# Patient Record
Sex: Female | Born: 1995 | Race: Black or African American | Hispanic: No | Marital: Single | Smoking: Never smoker
Health system: Southern US, Community
[De-identification: ages and names within clinical notes are randomized; demographics above are authoritative.]

---
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
## 2016-01-27 ENCOUNTER — Encounter: Payer: Self-pay | Admitting: Obstetrics & Gynecology

## 2016-01-27 ENCOUNTER — Ambulatory Visit (INDEPENDENT_AMBULATORY_CARE_PROVIDER_SITE_OTHER): Payer: BLUE CROSS/BLUE SHIELD | Admitting: Obstetrics & Gynecology

## 2016-01-27 VITALS — BP 106/56 | HR 55 | Temp 98.4°F | Ht 70.0 in | Wt 224.3 lb

## 2016-01-27 DIAGNOSIS — Z30431 Encounter for routine checking of intrauterine contraceptive device: Secondary | ICD-10-CM

## 2016-01-27 NOTE — Progress Notes (Signed)
   Subjective:    Patient ID: Daisy Roberts Reason, female    DOB: 10-Mar-1996, 20 y.o.   MRN: [id number]  HPI 20 yo S AA lady here for a string check. I placed a Lyletta about 5 weeks ago. She has had essentially no bleeding. She is happy with this method.   Review of Systems     Objective:   Physical Exam  WNWHBFNAD Breathing, conversing, and ambulating normally Abd- benign IUD strings seen      Assessment & Plan:  String check- doing well RTC 1 year/prn sooner

## 2017-05-25 ENCOUNTER — Encounter (HOSPITAL_COMMUNITY): Payer: Self-pay | Admitting: Emergency Medicine

## 2017-05-25 ENCOUNTER — Ambulatory Visit (HOSPITAL_COMMUNITY)
Admission: EM | Admit: 2017-05-25 | Discharge: 2017-05-25 | Disposition: A | Discharge status: Home / Self Care | Payer: PRIVATE HEALTH INSURANCE | Attending: Family Medicine | Admitting: Family Medicine

## 2017-05-25 ENCOUNTER — Ambulatory Visit (INDEPENDENT_AMBULATORY_CARE_PROVIDER_SITE_OTHER): Payer: PRIVATE HEALTH INSURANCE

## 2017-05-25 DIAGNOSIS — Z3202 Encounter for pregnancy test, result negative: Secondary | ICD-10-CM | POA: Diagnosis not present

## 2017-05-25 DIAGNOSIS — R1032 Left lower quadrant pain: Secondary | ICD-10-CM | POA: Diagnosis not present

## 2017-05-25 DIAGNOSIS — R109 Unspecified abdominal pain: Secondary | ICD-10-CM | POA: Diagnosis not present

## 2017-05-25 LAB — POCT URINALYSIS DIP (DEVICE)
Bilirubin Urine: NEGATIVE
GLUCOSE, UA: NEGATIVE mg/dL
Ketones, ur: NEGATIVE mg/dL
NITRITE: NEGATIVE
PH: 7.5 (ref 5.0–8.0)
Protein, ur: NEGATIVE mg/dL
Specific Gravity, Urine: 1.02 (ref 1.005–1.030)
Urobilinogen, UA: 0.2 mg/dL (ref 0.0–1.0)

## 2017-05-25 LAB — POCT PREGNANCY, URINE: PREG TEST UR: NEGATIVE

## 2017-05-25 NOTE — ED Provider Notes (Signed)
CSN: [id number]     Arrival date & time 05/25/17  1456 History   First MD Initiated Contact with Patient 05/25/17 1518     Chief Complaint  Patient presents with  . Abdominal Pain   (Consider location/radiation/quality/duration/timing/severity/associated sxs/prior Treatment) 21 year old female states she has been having abdominal pain primarily in the left lower quadrant every day for the past 4 days. She describes it as sharp in the left lower quadrant and pain that radiates toward the midline below the umbilicus. Occasionally has nausea but no vomiting. Denies constipation or diarrhea. Denies fever, chills, upper respiratory symptoms, chest pain, shortness of breath, palpitations, myalgias, urinary symptoms or vaginal discharge.      History reviewed. No pertinent past medical history. Past Surgical History:  Procedure Laterality Date  . KNEE SURGERY  2014   History reviewed. No pertinent family history. Social History  Substance Use Topics  . Smoking status: Never Smoker  . Smokeless tobacco: Never Used  . Alcohol use 0.0 oz/week     Comment: occasional drinker    OB History    Gravida Para Term Preterm AB Living   0 0 0 0 0 0   SAB TAB Ectopic Multiple Live Births   0 0 0 0       Review of Systems  Constitutional: Negative.   Gastrointestinal:       See history of present illness  Neurological: Negative.   All other systems reviewed and are negative.   Allergies  Patient has no known allergies.  Home Medications   Prior to Admission medications   Medication Sig Start Date End Date Taking? Authorizing Provider  ferrous sulfate 325 (65 FE) MG tablet Take 325 mg by mouth daily with breakfast.    [provider]   Meds Ordered and Administered this Visit  Medications - No data to display  BP 120/76 (BP Location: Right Arm)   Pulse 65   Temp 98.7 F (37.1 C) (Oral)   SpO2 98%  No data found.   Physical Exam  Constitutional: She is oriented to  person, place, and time. She appears well-developed and well-nourished. No distress.  Neck: Normal range of motion. Neck supple.  Cardiovascular: Normal rate, regular rhythm, normal heart sounds and intact distal pulses.   Pulmonary/Chest: Effort normal and breath sounds normal. No respiratory distress.  Abdominal: Soft. She exhibits no distension. There is no tenderness.  Percussion tympanic in the epigastrium. The left hemiabdomen and left lower quadrant percusses dull to flat. There is tenderness in the left lower most quadrant and lesser tenderness at the midline/suprapubic area.  Musculoskeletal: Normal range of motion. She exhibits no edema.  Neurological: She is alert and oriented to person, place, and time. No cranial nerve deficit. She exhibits normal muscle tone.  Skin: Skin is warm and dry.  Psychiatric: She has a normal mood and affect.  Nursing note and vitals reviewed.   Urgent Care Course     Procedures (including critical care time)  Labs Review Labs Reviewed  POCT URINALYSIS DIP (DEVICE) - Abnormal; Notable for the following:       Result Value   Hgb urine dipstick TRACE (*)    Leukocytes, UA TRACE (*)    All other components within normal limits  POCT PREGNANCY, URINE    Imaging Review Dg Abd 1 View  Result Date: 05/25/2017 CLINICAL DATA:  Abdominal pain EXAM: ABDOMEN - 1 VIEW COMPARISON:  None. FINDINGS: There is moderate stool in the colon. There is no bowel  dilatation or air-fluid level to suggest bowel obstruction. No free air. There is an intrauterine device in mid pelvis just to the left of midline. IMPRESSION: Intrauterine device in pelvis. Moderate stool throughout colon. No bowel obstruction or free air. Electronically Signed   By: Bretta BangWilliam  Woodruff III M.D.   On: 05/25/2017 16:23     Visual Acuity Review  Right Eye Distance:   Left Eye Distance:   Bilateral Distance:    Right Eye Near:   Left Eye Near:    Bilateral Near:         MDM   1.  Left lower quadrant pain    Use MiraLAX as directed. 3 glasses initially. If not having good results in about 6 hours repeat to other glasses. Drink plenty of fluids. Increase fiber in  diet. If you develop worsening pain. Nausea vomiting, fever, chills and you may need to go to the emergency department.    Hayden RasmussenMabe, Xzaviar Maloof, NP 05/25/17 [phone]

## 2017-05-25 NOTE — ED Triage Notes (Signed)
Pt reports abdominal pain that began on Monday.  She states it is cramping in the mid lower portion of her abdomen and sharp in the LLQ.  She also reports some nausea with the pain and loss of appetite due to the nausea.

## 2017-05-25 NOTE — Discharge Instructions (Signed)
Use MiraLAX as directed. 3 glasses initially. If not having good results in about 6 hours repeat to other glasses. Drink plenty of fluids. Increase fiber in  diet. If you develop worsening pain. Nausea vomiting, fever, chills and he may need to go to the emergency department.

## 2017-05-26 ENCOUNTER — Emergency Department (HOSPITAL_COMMUNITY)
Admission: EM | Admit: 2017-05-26 | Discharge: 2017-05-26 | Disposition: A | Discharge status: Home / Self Care | Payer: No Typology Code available for payment source | Attending: Emergency Medicine | Admitting: Emergency Medicine

## 2017-05-26 ENCOUNTER — Encounter (HOSPITAL_COMMUNITY): Payer: Self-pay | Admitting: Emergency Medicine

## 2017-05-26 ENCOUNTER — Emergency Department (HOSPITAL_COMMUNITY): Payer: No Typology Code available for payment source

## 2017-05-26 DIAGNOSIS — R109 Unspecified abdominal pain: Secondary | ICD-10-CM | POA: Diagnosis present

## 2017-05-26 DIAGNOSIS — N390 Urinary tract infection, site not specified: Secondary | ICD-10-CM | POA: Insufficient documentation

## 2017-05-26 LAB — COMPREHENSIVE METABOLIC PANEL
ALT: 8 U/L — AB (ref 14–54)
AST: 16 U/L (ref 15–41)
Albumin: 4.2 g/dL (ref 3.5–5.0)
Alkaline Phosphatase: 43 U/L (ref 38–126)
Anion gap: 10 (ref 5–15)
BUN: 10 mg/dL (ref 6–20)
CHLORIDE: 105 mmol/L (ref 101–111)
CO2: 25 mmol/L (ref 22–32)
CREATININE: 0.87 mg/dL (ref 0.44–1.00)
Calcium: 9.2 mg/dL (ref 8.9–10.3)
GFR calc Af Amer: >60 mL/min (ref >60)
GLUCOSE: 89 mg/dL (ref 65–99)
Potassium: 3.9 mmol/L (ref 3.5–5.1)
Sodium: 140 mmol/L (ref 135–145)
Total Bilirubin: 1 mg/dL (ref 0.3–1.2)
Total Protein: 7.1 g/dL (ref 6.5–8.1)

## 2017-05-26 LAB — CBC
HCT: 38.9 % (ref 36.0–46.0)
Hemoglobin: 13.8 g/dL (ref 12.0–15.0)
MCH: 29.4 pg (ref 26.0–34.0)
MCHC: 35.5 g/dL (ref 30.0–36.0)
MCV: 82.9 fL (ref 78.0–100.0)
PLATELETS: 197 10*3/uL (ref 150–400)
RBC: 4.69 MIL/uL (ref 3.87–5.11)
RDW: 12 % (ref 11.5–15.5)
WBC: 6.7 10*3/uL (ref 4.0–10.5)

## 2017-05-26 LAB — URINALYSIS, ROUTINE W REFLEX MICROSCOPIC
Bilirubin Urine: NEGATIVE
Glucose, UA: NEGATIVE mg/dL
Ketones, ur: 5 mg/dL — AB
Nitrite: NEGATIVE
PH: 5 (ref 5.0–8.0)
Protein, ur: 100 mg/dL — AB
SPECIFIC GRAVITY, URINE: 1.021 (ref 1.005–1.030)

## 2017-05-26 LAB — PREGNANCY, URINE: PREG TEST UR: NEGATIVE

## 2017-05-26 LAB — LIPASE, BLOOD: LIPASE: 21 U/L (ref 11–51)

## 2017-05-26 MED ORDER — DEXTROSE 5 % IV SOLN
1.0000 g | Freq: Once | INTRAVENOUS | Status: AC
  Administered 2017-05-26: 1 g via INTRAVENOUS
  Filled 2017-05-26: qty 10

## 2017-05-26 MED ORDER — CEPHALEXIN 500 MG PO CAPS: 500.0000 mg | ORAL_CAPSULE | Freq: Four times a day (QID) | ORAL | 0 refills | Status: DC

## 2017-05-26 NOTE — Discharge Instructions (Addendum)
It was our pleasure to provide your ER care today - we hope that you feel better.  Rest. Drink plenty of fluids.  For urine infection, take antibiotic as prescribed.  You may take acetaminophen and/or ibuprofen as need for pain.  Follow up with primary care doctor in the coming week.  Return to ER if worse, new symptoms, high fevers, severe pain, persistent vomiting, other concern.

## 2017-05-26 NOTE — ED Notes (Signed)
Patient transported to CT 

## 2017-05-26 NOTE — ED Triage Notes (Signed)
Pt seen at Urgent care yesterday -- dx with constipation -- no hx of-- prescribed miralax-- has taken 6 caps last night and 1 this am. Minimal results-- still has abd pain, sharp stabbing feeling.

## 2017-05-26 NOTE — ED Provider Notes (Signed)
[facility]-EMERGENCY DEPT Provider Note   CSN: [id number] Arrival date & time: 05/26/17  1343     History   Chief Complaint Chief Complaint  Patient presents with  . Abdominal Pain    HPI Kalana Yust is a 21 y.o. female.  Patient c/o right flank pain in past 4 days. Pain mod-sev. Waxes and wanes in intensity. No hx same pain. Noted hematuria. No dysuria. Denies fever or chills. Denies vaginal bleeding or d/c. Has iud, and states periods v light, last 2 weeks ago. No vaginal discharge or bleeding. No hx kidney stones. No hx ovarian cyst or endometriosis. Denies back pain. +recnet constipation, used miralax as did have bm yesterday.    The history is provided by the patient.  Abdominal Pain   Associated symptoms include hematuria. Pertinent negatives include fever, vomiting, dysuria and headaches.    History reviewed. No pertinent past medical history.  There are no active problems to display for this patient.   Past Surgical History:  Procedure Laterality Date  . KNEE SURGERY  2014    OB History    Gravida Para Term Preterm AB Living   0 0 0 0 0 0   SAB TAB Ectopic Multiple Live Births   0 0 0 0         Home Medications    Prior to Admission medications   Medication Sig Start Date End Date Taking? Authorizing Provider  ferrous sulfate 325 (65 FE) MG tablet Take 325 mg by mouth daily with breakfast.    [provider]    Family History No family history on file.  Social History Social History  Substance Use Topics  . Smoking status: Never Smoker  . Smokeless tobacco: Never Used  . Alcohol use 0.0 oz/week     Comment: occasional drinker      Allergies   Patient has no known allergies.   Review of Systems Review of Systems  Constitutional: Negative for chills and fever.  HENT: Negative for sore throat.   Eyes: Negative for redness.  Respiratory: Negative for cough and shortness of breath.   Cardiovascular: Negative for chest pain.    Gastrointestinal: Positive for abdominal pain. Negative for vomiting.  Endocrine: Negative for polyuria.  Genitourinary: Positive for flank pain and hematuria. Negative for dysuria.  Musculoskeletal: Negative for back pain.  Skin: Negative for rash.  Neurological: Negative for headaches.  Hematological: Does not bruise/bleed easily.  Psychiatric/Behavioral: Negative for confusion.     Physical Exam Updated Vital Signs BP 118/72   Pulse 83   Temp 98.5 F (36.9 C) (Oral)   Resp 16   Ht 1.778 m (5\' 10" )   Wt 88.5 kg (195 lb)   SpO2 100%   BMI 27.98 kg/m   Physical Exam  Constitutional: She appears well-developed and well-nourished. No distress.  HENT:  Head: Atraumatic.  Eyes: Conjunctivae are normal. No scleral icterus.  Neck: Neck supple. No tracheal deviation present.  Cardiovascular: Normal rate and intact distal pulses.   Pulmonary/Chest: Effort normal and breath sounds normal. No respiratory distress.  Abdominal: Soft. Normal appearance and bowel sounds are normal. She exhibits no distension. There is no tenderness.  No hernia.   Genitourinary:  Genitourinary Comments: No cva tenderness  Musculoskeletal: She exhibits no edema.  Neurological: She is alert.  Skin: Skin is warm and dry. No rash noted. She is not diaphoretic.  Psychiatric: She has a normal mood and affect.  Nursing note and vitals reviewed.    ED Treatments /  Results  Labs (all labs ordered are listed, but only abnormal results are displayed)  Results for orders placed or performed during the hospital encounter of 05/26/17  Lipase, blood  Result Value Ref Range   Lipase 21 11 - 51 U/L  Comprehensive metabolic panel  Result Value Ref Range   Sodium 140 135 - 145 mmol/L   Potassium 3.9 3.5 - 5.1 mmol/L   Chloride 105 101 - 111 mmol/L   CO2 25 22 - 32 mmol/L   Glucose, Bld 89 65 - 99 mg/dL   BUN 10 6 - 20 mg/dL   Creatinine, Ser 1.610.87 0.44 - 1.00 mg/dL   Calcium 9.2 8.9 - 09.610.3 mg/dL   Total  Protein 7.1 6.5 - 8.1 g/dL   Albumin 4.2 3.5 - 5.0 g/dL   AST 16 15 - 41 U/L   ALT 8 (L) 14 - 54 U/L   Alkaline Phosphatase 43 38 - 126 U/L   Total Bilirubin 1.0 0.3 - 1.2 mg/dL   GFR calc non Af Amer >60 >60 mL/min   GFR calc Af Amer >60 >60 mL/min   Anion gap 10 5 - 15  CBC  Result Value Ref Range   WBC 6.7 4.0 - 10.5 K/uL   RBC 4.69 3.87 - 5.11 MIL/uL   Hemoglobin 13.8 12.0 - 15.0 g/dL   HCT 04.538.9 40.936.0 - 81.146.0 %   MCV 82.9 78.0 - 100.0 fL   MCH 29.4 26.0 - 34.0 pg   MCHC 35.5 30.0 - 36.0 g/dL   RDW 91.412.0 78.211.5 - 95.615.5 %   Platelets 197 150 - 400 K/uL  Urinalysis, Routine w reflex microscopic  Result Value Ref Range   Color, Urine AMBER (A) YELLOW   APPearance CLOUDY (A) CLEAR   Specific Gravity, Urine 1.021 1.005 - 1.030   pH 5.0 5.0 - 8.0   Glucose, UA NEGATIVE NEGATIVE mg/dL   Hgb urine dipstick LARGE (A) NEGATIVE   Bilirubin Urine NEGATIVE NEGATIVE   Ketones, ur 5 (A) NEGATIVE mg/dL   Protein, ur 213100 (A) NEGATIVE mg/dL   Nitrite NEGATIVE NEGATIVE   Leukocytes, UA LARGE (A) NEGATIVE   RBC / HPF TOO NUMEROUS TO COUNT 0 - 5 RBC/hpf   WBC, UA TOO NUMEROUS TO COUNT 0 - 5 WBC/hpf   Bacteria, UA MANY (A) NONE SEEN   Squamous Epithelial / LPF TOO NUMEROUS TO COUNT (A) NONE SEEN   Mucous PRESENT   Pregnancy, urine  Result Value Ref Range   Preg Test, Ur NEGATIVE NEGATIVE   Dg Abd 1 View  Result Date: 05/25/2017 CLINICAL DATA:  Abdominal pain EXAM: ABDOMEN - 1 VIEW COMPARISON:  None. FINDINGS: There is moderate stool in the colon. There is no bowel dilatation or air-fluid level to suggest bowel obstruction. No free air. There is an intrauterine device in mid pelvis just to the left of midline. IMPRESSION: Intrauterine device in pelvis. Moderate stool throughout colon. No bowel obstruction or free air. Electronically Signed   By: Bretta BangWilliam  Woodruff III M.D.   On: 05/25/2017 16:23   Ct Renal Stone Study  Result Date: 05/26/2017 CLINICAL DATA:  Left flank pain and hematuria. EXAM:  CT ABDOMEN AND PELVIS WITHOUT CONTRAST TECHNIQUE: Multidetector CT imaging of the abdomen and pelvis was performed following the standard protocol without IV contrast. COMPARISON:  Abdominal radiograph yesterday. FINDINGS: Lower chest: The lung bases are clear. Hepatobiliary: No evidence of focal lesion allowing for lack contrast. Gallbladder physiologically distended, no calcified stone. No biliary dilatation. Pancreas: No  ductal dilatation or inflammation. Lack of contrast and paucity of intra-abdominal fat limits assessment. Spleen: Normal in size without focal abnormality on noncontrast exam. Adrenals/Urinary Tract: Kidneys are symmetric in size without stones or hydronephrosis. No perinephric edema. Ureters are decompressed without stones along the course. Urinary bladder is physiologically distended without stone. There is mild perivesicular soft tissue edema. Adrenal glands are poorly visualized, no evidence of nodule. Stomach/Bowel: Lack of contrast and paucity of intra-abdominal fat limits assessment. Stomach is decompressed. No evidence of bowel obstruction. The appendix is normal. Small to moderate colonic stool burden. No evidence of bowel inflammation allowing for lack contrast. Vascular/Lymphatic: Abdominal aorta is normal in caliber. No bulky abdominal or pelvic adenopathy. Reproductive: Uterus is anteverted. IUD in the uterus. Ovaries not confidently identified. No gross adnexal mass. Small volume of pelvic free fluid is likely physiologic. Other: Small volume of pelvic free fluid is likely physiologic. No upper abdominal ascites. No free air. Musculoskeletal: There are no acute or suspicious osseous abnormalities. IMPRESSION: 1.  No renal stones or obstructive uropathy. 2. Mild perivesicular soft tissue edema suggests cystitis. Electronically Signed   By: Rubye Oaks M.D.   On: 05/26/2017 21:37      EKG  EKG Interpretation None       Radiology Dg Abd 1 View  Result Date:  05/25/2017 CLINICAL DATA:  Abdominal pain EXAM: ABDOMEN - 1 VIEW COMPARISON:  None. FINDINGS: There is moderate stool in the colon. There is no bowel dilatation or air-fluid level to suggest bowel obstruction. No free air. There is an intrauterine device in mid pelvis just to the left of midline. IMPRESSION: Intrauterine device in pelvis. Moderate stool throughout colon. No bowel obstruction or free air. Electronically Signed   By: Bretta Bang III M.D.   On: 05/25/2017 16:23    Procedures Procedures (including critical care time)  Medications Ordered in ED Medications - No data to display   Initial Impression / Assessment and Plan / ED Course  I have reviewed the triage vital signs and the nursing notes.  Pertinent labs & imaging results that were available during my care of the patient were reviewed by me and considered in my medical decision making (see chart for details).  Labs, imaging study.  Reviewed nursing notes and prior charts for additional history.   Iv ns bolus.   CT pending.  CT neg for ureteral stone.   UA positive for infxn. Rocephin iv.  Recheck pt, feels improved.   rx for home.   Return precautions provided.     Final Clinical Impressions(s) / ED Diagnoses   Final diagnoses:  None    New Prescriptions New Prescriptions   No medications on file     Cathren Laine, MD 05/26/17 2202

## 2017-05-26 NOTE — ED Notes (Signed)
Pt verbalized understanding of d/c instructions and has no further questions. VSS. NAD.  

## 2017-11-13 ENCOUNTER — Ambulatory Visit: Payer: Self-pay | Admitting: Obstetrics & Gynecology

## 2017-11-13 DIAGNOSIS — Z30431 Encounter for routine checking of intrauterine contraceptive device: Secondary | ICD-10-CM

## 2017-11-13 NOTE — Progress Notes (Signed)
   Subjective:    Patient ID: Daisy Roberts, female    DOB: Aug 26, 1996, 22 y.o.   MRN: [id number]  HPI 22 yo single lady here because she thought that her Liletta needed to be replaced. I put it in on 12/14/15. She is happy with the Liletta. She is having no current complaints.  Review of Systems     Objective:   Physical Exam Breathing, conversing, and ambulating normally Well nourished, well hydrated Black female, no apparent distress     Assessment & Plan:  Contraception- continue with Liletta Come back when she is 22 for a pap smear.

## 2018-07-24 ENCOUNTER — Encounter (HOSPITAL_COMMUNITY): Payer: Self-pay | Admitting: Emergency Medicine

## 2018-07-24 ENCOUNTER — Emergency Department (HOSPITAL_COMMUNITY)
Admission: EM | Admit: 2018-07-24 | Discharge: 2018-07-24 | Disposition: A | Discharge status: Home / Self Care | Payer: No Typology Code available for payment source | Attending: Emergency Medicine | Admitting: Emergency Medicine

## 2018-07-24 ENCOUNTER — Emergency Department (HOSPITAL_COMMUNITY): Payer: No Typology Code available for payment source

## 2018-07-24 DIAGNOSIS — Z23 Encounter for immunization: Secondary | ICD-10-CM | POA: Insufficient documentation

## 2018-07-24 DIAGNOSIS — Y998 Other external cause status: Secondary | ICD-10-CM | POA: Insufficient documentation

## 2018-07-24 DIAGNOSIS — Y9289 Other specified places as the place of occurrence of the external cause: Secondary | ICD-10-CM | POA: Diagnosis not present

## 2018-07-24 DIAGNOSIS — W260XXA Contact with knife, initial encounter: Secondary | ICD-10-CM | POA: Insufficient documentation

## 2018-07-24 DIAGNOSIS — S61210A Laceration without foreign body of right index finger without damage to nail, initial encounter: Secondary | ICD-10-CM | POA: Diagnosis not present

## 2018-07-24 DIAGNOSIS — Y93G1 Activity, food preparation and clean up: Secondary | ICD-10-CM | POA: Insufficient documentation

## 2018-07-24 MED ORDER — TETANUS-DIPHTH-ACELL PERTUSSIS 5-2.5-18.5 LF-MCG/0.5 IM SUSP
0.5000 mL | Freq: Once | INTRAMUSCULAR | Status: AC
  Administered 2018-07-24: 0.5 mL via INTRAMUSCULAR
  Filled 2018-07-24: qty 0.5

## 2018-07-24 NOTE — ED Notes (Signed)
Patient verbalizes understanding of discharge instructions. Opportunity for questioning and answers were provided. Armband removed by staff, pt discharged from ED ambulatory.   

## 2018-07-24 NOTE — Discharge Instructions (Addendum)
Keep wound clean and dry. Wound recheck in 2 days with Worker's Comp.  Return to ER for worsening or concerning symptoms.

## 2018-07-24 NOTE — ED Provider Notes (Signed)
[facility] EMERGENCY DEPARTMENT Provider Note   CSN: [id number] Arrival date & time: 07/24/18  1653     History   Chief Complaint Chief Complaint  Patient presents with  . Finger Injury    HPI Daisy Roberts is a 22 y.o. female.  22 year old female presents with laceration to the right index finger.  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  Patient is left-hand dominant.  Patient denies drainage from the wound, has been cleaning at home.  Last tetanus is greater than 5 years ago, patient refuses tetanus update today.  No other injuries, complaints or concerns.     History reviewed. No pertinent past medical history.  There are no active problems to display for this patient.   Past Surgical History:  Procedure Laterality Date  . KNEE SURGERY  2014     OB History    Gravida  0   Para  0   Term  0   Preterm  0   AB  0   Living  0     SAB  0   TAB  0   Ectopic  0   Multiple  0   Live Births               Home Medications    Prior to Admission medications   Medication Sig Start Date End Date Taking? Authorizing Provider  cephALEXin (KEFLEX) 500 MG capsule Take 1 capsule (500 mg total) by mouth 4 (four) times daily. 05/26/17   Cathren Laine, MD  ferrous sulfate 325 (65 FE) MG tablet Take 325 mg by mouth daily with breakfast.    [provider]  ibuprofen (ADVIL,MOTRIN) 200 MG tablet Take 600 mg by mouth every 6 (six) hours as needed (pain).    [provider]  Levonorgestrel (KYLEENA) 19.5 MG IUD by Intrauterine route. Implanted winter 2016    [provider]  polyethylene glycol (MIRALAX / GLYCOLAX) packet Take 17 g by mouth every 30 (thirty) minutes as needed for severe constipation. Mix in 8 oz fluid and drink    [provider]    Family History History reviewed. No pertinent family history.  Social History Social  History   Tobacco Use  . Smoking status: Never Smoker  . Smokeless tobacco: Never Used  Substance Use Topics  . Alcohol use: Yes    Alcohol/week: 0.0 standard drinks    Comment: occasional drinker   . Drug use: No     Allergies   Patient has no known allergies.   Review of Systems Review of Systems  Constitutional: Negative for fever.  Musculoskeletal: Positive for myalgias. Negative for arthralgias and joint swelling.  Skin: Positive for wound.  Allergic/Immunologic: Negative for immunocompromised state.  Neurological: Negative for weakness and numbness.  Hematological: Negative for adenopathy. Does not bruise/bleed easily.  Psychiatric/Behavioral: Negative for confusion.  All other systems reviewed and are negative.    Physical Exam Updated Vital Signs BP 125/73 (BP Location: Left Arm)   Pulse 81   Temp 98.9 F (37.2 C) (Oral)   Resp 16   LMP 07/18/2018 (Exact Date)   SpO2 100%   Physical Exam  Constitutional: She is oriented to person, place, and time. She appears well-developed and well-nourished. No distress.  HENT:  Head: Normocephalic and atraumatic.  Cardiovascular: Intact distal pulses.  Pulmonary/Chest: Effort normal.  Musculoskeletal: She exhibits tenderness. She exhibits no deformity.  Hands: Neurological: She is alert and oriented to person, place, and time. No sensory deficit.  Skin: Skin is warm and dry. No rash noted. She is not diaphoretic. No erythema.  Psychiatric: She has a normal mood and affect. Her behavior is normal.  Nursing note and vitals reviewed.    ED Treatments / Results  Labs (all labs ordered are listed, but only abnormal results are displayed) Labs Reviewed - No data to display  EKG None  Radiology Dg Finger Index Right  Result Date: 07/24/2018 CLINICAL DATA:  Recent laceration in the distal right second finger EXAM: RIGHT INDEX FINGER 2+V COMPARISON:  None. FINDINGS: Punctate soft tissue density abuts volar  proximal aspect of middle phalanx on the lateral view at the PIP joint, equivocal for normal variant accessory sesamoid versus less likely a radiopaque foreign body. No additional potential radiopaque foreign body. No acute fracture. No suspicious focal osseous lesion. No dislocation. No significant arthropathy. IMPRESSION: No acute fracture or dislocation. Punctate soft tissue density abutting the volar proximal aspect of the middle phalanx on the lateral view at the PIP joint, equivocal for normal variant accessory sesamoid versus less likely a tiny radiopaque foreign body. Correlate with site of injury. Electronically Signed   By: Delbert PhenixJason A Poff M.D.   On: 07/24/2018 18:11    Procedures .Marland Kitchen.Laceration Repair Date/Time: 07/24/2018 6:33 PM Performed by: Jeannie FendMurphy, Laura A, PA-C Authorized by: Jeannie FendMurphy, Laura A, PA-C   Consent:    Consent obtained:  Verbal   Consent given by:  Patient   Risks discussed:  Infection, poor cosmetic result and poor wound healing   Alternatives discussed:  No treatment Anesthesia (see MAR for exact dosages):    Anesthesia method:  None Laceration details:    Length (cm):  1.5   Depth (mm):  4 Repair type:    Repair type:  Simple Pre-procedure details:    Preparation:  Patient was prepped and draped in usual sterile fashion and imaging obtained to evaluate for foreign bodies Exploration:    Wound exploration: wound explored through full range of motion and entire depth of wound probed and visualized     Wound extent: no foreign bodies/material noted, no muscle damage noted, no nerve damage noted, no tendon damage noted and no underlying fracture noted     Contaminated: no   Treatment:    Area cleansed with:  Saline and Betadine   Amount of cleaning:  Extensive   Irrigation solution:  Sterile saline Skin repair:    Repair method:  Steri-Strips   Number of Steri-Strips:  1 Approximation:    Approximation:  Loose Post-procedure details:    Dressing:  Bulky dressing  and splint for protection   Patient tolerance of procedure:  Tolerated well, no immediate complications   (including critical care time)  Medications Ordered in ED Medications  Tdap (BOOSTRIX) injection 0.5 mL (has no administration in time range)     Initial Impression / Assessment and Plan / ED Course  I have reviewed the triage vital signs and the nursing notes.  Pertinent labs & imaging results that were available during my care of the patient were reviewed by me and considered in my medical decision making (see chart for details).  Clinical Course as of Jul 24 1833  Tue Jul 24, 2018  [id number] 22 year old female with laceration to the right index finger, middle phalanx.  Injury occurred 3 days ago.  Tetanus was last given more than 5 years ago, patient refuses updated tetanus today, she is  aware that tetanus can be a fatal illness.  On exam patient has a flap shaped laceration to the dorsal aspect of the right index finger, middle phalanx.  There is no bleeding or signs of infection.  X-ray shows sesamoid versus punctate foreign body on the palmar aspect of the finger, this area does not correlate with the site of the patient's injury today.  Injury was thoroughly cleaned and dressed with one Steri-Strip.   [LM]  1833 At discharge patient agrees to Tdap vaccine, Tdap updated today.   [LM]    Clinical Course User Index [LM] Jeannie Fend, PA-C    Final Clinical Impressions(s) / ED Diagnoses   Final diagnoses:  Laceration of right index finger without foreign body without damage to nail, initial encounter    ED Discharge Orders    None       Alden Hipp 07/24/18 Maryan Char, MD 08/02/18 1328

## 2018-07-24 NOTE — ED Notes (Signed)
Patient transported to X-ray 

## 2018-07-24 NOTE — ED Triage Notes (Signed)
Pt presents to ED for assessment of laceration/skin flap to patient's index finger of her right hand x 3 days.  Patient used iodine and antiseptic on her finger with bandages.  Flap of skin noted, able to be lifted.

## 2019-05-18 ENCOUNTER — Emergency Department (HOSPITAL_COMMUNITY): Payer: PRIVATE HEALTH INSURANCE

## 2019-05-18 ENCOUNTER — Other Ambulatory Visit: Payer: Self-pay

## 2019-05-18 ENCOUNTER — Encounter (HOSPITAL_COMMUNITY): Payer: Self-pay

## 2019-05-18 ENCOUNTER — Emergency Department (HOSPITAL_COMMUNITY)
Admission: EM | Admit: 2019-05-18 | Discharge: 2019-05-18 | Disposition: A | Discharge status: Home / Self Care | Payer: PRIVATE HEALTH INSURANCE | Attending: Emergency Medicine | Admitting: Emergency Medicine

## 2019-05-18 ENCOUNTER — Ambulatory Visit (INDEPENDENT_AMBULATORY_CARE_PROVIDER_SITE_OTHER)
Admission: EM | Admit: 2019-05-18 | Discharge: 2019-05-18 | Disposition: A | Discharge status: Home / Self Care | Payer: PRIVATE HEALTH INSURANCE | Source: Home / Self Care

## 2019-05-18 DIAGNOSIS — Z3202 Encounter for pregnancy test, result negative: Secondary | ICD-10-CM

## 2019-05-18 DIAGNOSIS — R10814 Left lower quadrant abdominal tenderness: Secondary | ICD-10-CM | POA: Insufficient documentation

## 2019-05-18 DIAGNOSIS — N760 Acute vaginitis: Secondary | ICD-10-CM | POA: Insufficient documentation

## 2019-05-18 DIAGNOSIS — B9689 Other specified bacterial agents as the cause of diseases classified elsewhere: Secondary | ICD-10-CM | POA: Insufficient documentation

## 2019-05-18 DIAGNOSIS — R110 Nausea: Secondary | ICD-10-CM

## 2019-05-18 DIAGNOSIS — R1031 Right lower quadrant pain: Secondary | ICD-10-CM | POA: Diagnosis not present

## 2019-05-18 DIAGNOSIS — R112 Nausea with vomiting, unspecified: Secondary | ICD-10-CM | POA: Insufficient documentation

## 2019-05-18 DIAGNOSIS — R10815 Periumbilic abdominal tenderness: Secondary | ICD-10-CM | POA: Diagnosis not present

## 2019-05-18 DIAGNOSIS — R10811 Right upper quadrant abdominal tenderness: Secondary | ICD-10-CM | POA: Diagnosis not present

## 2019-05-18 DIAGNOSIS — N3001 Acute cystitis with hematuria: Secondary | ICD-10-CM | POA: Diagnosis not present

## 2019-05-18 LAB — I-STAT BETA HCG BLOOD, ED (MC, WL, AP ONLY): I-stat hCG, quantitative: <5 m[IU]/mL (ref <5)

## 2019-05-18 LAB — POCT URINALYSIS DIP (DEVICE)
Bilirubin Urine: NEGATIVE
Glucose, UA: NEGATIVE mg/dL
Ketones, ur: NEGATIVE mg/dL
Nitrite: NEGATIVE
Protein, ur: 100 mg/dL — AB
Specific Gravity, Urine: 1.01 (ref 1.005–1.030)
Urobilinogen, UA: 0.2 mg/dL (ref 0.0–1.0)
pH: 6.5 (ref 5.0–8.0)

## 2019-05-18 LAB — COMPREHENSIVE METABOLIC PANEL
ALT: 10 U/L (ref 0–44)
AST: 14 U/L — ABNORMAL LOW (ref 15–41)
Albumin: 3.7 g/dL (ref 3.5–5.0)
Alkaline Phosphatase: 45 U/L (ref 38–126)
Anion gap: 10 (ref 5–15)
BUN: 10 mg/dL (ref 6–20)
CO2: 24 mmol/L (ref 22–32)
Calcium: 8.9 mg/dL (ref 8.9–10.3)
Chloride: 103 mmol/L (ref 98–111)
Creatinine, Ser: 0.82 mg/dL (ref 0.44–1.00)
GFR calc Af Amer: >60 mL/min (ref >60)
GFR calc non Af Amer: >60 mL/min (ref >60)
Glucose, Bld: 100 mg/dL — ABNORMAL HIGH (ref 70–99)
Potassium: 3.8 mmol/L (ref 3.5–5.1)
Sodium: 137 mmol/L (ref 135–145)
Total Bilirubin: 0.9 mg/dL (ref 0.3–1.2)
Total Protein: 7.4 g/dL (ref 6.5–8.1)

## 2019-05-18 LAB — URINALYSIS, ROUTINE W REFLEX MICROSCOPIC
Bilirubin Urine: NEGATIVE
Glucose, UA: NEGATIVE mg/dL
Ketones, ur: 5 mg/dL — AB
Nitrite: NEGATIVE
Protein, ur: NEGATIVE mg/dL
Specific Gravity, Urine: 1.016 (ref 1.005–1.030)
WBC, UA: >50 WBC/hpf — ABNORMAL HIGH (ref 0–5)
pH: 6 (ref 5.0–8.0)

## 2019-05-18 LAB — CBC
HCT: 38.5 % (ref 36.0–46.0)
Hemoglobin: 13.1 g/dL (ref 12.0–15.0)
MCH: 29 pg (ref 26.0–34.0)
MCHC: 34 g/dL (ref 30.0–36.0)
MCV: 85.4 fL (ref 80.0–100.0)
Platelets: 197 10*3/uL (ref 150–400)
RBC: 4.51 MIL/uL (ref 3.87–5.11)
RDW: 11.9 % (ref 11.5–15.5)
WBC: 11.9 10*3/uL — ABNORMAL HIGH (ref 4.0–10.5)
nRBC: 0 % (ref 0.0–0.2)

## 2019-05-18 LAB — WET PREP, GENITAL
Sperm: NONE SEEN
Trich, Wet Prep: NONE SEEN
Yeast Wet Prep HPF POC: NONE SEEN

## 2019-05-18 LAB — LIPASE, BLOOD: Lipase: 22 U/L (ref 11–51)

## 2019-05-18 LAB — POCT PREGNANCY, URINE: Preg Test, Ur: NEGATIVE

## 2019-05-18 MED ORDER — METRONIDAZOLE 500 MG PO TABS: 500.0000 mg | ORAL_TABLET | Freq: Two times a day (BID) | ORAL | 0 refills | Status: AC

## 2019-05-18 MED ORDER — SODIUM CHLORIDE 0.9 % IV BOLUS
1000.0000 mL | Freq: Once | INTRAVENOUS | Status: AC
  Administered 2019-05-18: 1000 mL via INTRAVENOUS

## 2019-05-18 MED ORDER — IOHEXOL 300 MG/ML  SOLN
100.0000 mL | Freq: Once | INTRAMUSCULAR | Status: AC | PRN
  Administered 2019-05-18: 100 mL via INTRAVENOUS

## 2019-05-18 MED ORDER — CEPHALEXIN 500 MG PO CAPS: 500.0000 mg | ORAL_CAPSULE | Freq: Two times a day (BID) | ORAL | 0 refills | Status: AC

## 2019-05-18 MED ORDER — ONDANSETRON 4 MG PO TBDP: 4.0000 mg | ORAL_TABLET | Freq: Three times a day (TID) | ORAL | 0 refills | Status: DC | PRN

## 2019-05-18 MED ORDER — KETOROLAC TROMETHAMINE 30 MG/ML IJ SOLN
30.0000 mg | Freq: Once | INTRAMUSCULAR | Status: AC
  Administered 2019-05-18: 30 mg via INTRAVENOUS
  Filled 2019-05-18: qty 1

## 2019-05-18 MED ORDER — NAPROXEN 500 MG PO TABS: 500.0000 mg | ORAL_TABLET | Freq: Two times a day (BID) | ORAL | 0 refills | Status: DC

## 2019-05-18 MED ORDER — MORPHINE SULFATE (PF) 4 MG/ML IV SOLN
4.0000 mg | Freq: Once | INTRAVENOUS | Status: AC
  Administered 2019-05-18: 4 mg via INTRAVENOUS
  Filled 2019-05-18: qty 1

## 2019-05-18 MED ORDER — ONDANSETRON HCL 4 MG/2ML IJ SOLN
4.0000 mg | Freq: Once | INTRAMUSCULAR | Status: AC
  Administered 2019-05-18: 4 mg via INTRAVENOUS
  Filled 2019-05-18: qty 2

## 2019-05-18 NOTE — ED Notes (Signed)
Patient asked for urine sample. She states that she just went at [place] and is unable to give sample at this time.

## 2019-05-18 NOTE — ED Triage Notes (Addendum)
Per pt she has been having abdominal cramping and pain for about 2 weeks. Pt says has been nauseated but no vomiting until this morning. Hurting in the lower right quadrant. No fevers. Pt says when she moves it makes it hurt more.

## 2019-05-18 NOTE — ED Notes (Signed)
Pelvic cart at bedside. 

## 2019-05-18 NOTE — ED Notes (Signed)
Patient undressing into hospital gown at this time.

## 2019-05-18 NOTE — ED Provider Notes (Signed)
[place] EMERGENCY DEPARTMENT Provider Note   CSN: [id number] Arrival date & time: 05/18/19  1247    History   Chief Complaint Chief Complaint  Patient presents with   Abdominal Pain    HPI Daisy Roberts is a 23 y.o. female presents for evaluation of acute onset, progressively worsening right lower quadrant abdominal pain for 2 weeks.  She reports that over the last couple of days the pain has worsened.  It is sharp, worsens with movement, radiates to the suprapubic region.  Has had nausea for the last 2 weeks but today had one episode of nonbloody nonbilious emesis which prompted further evaluation so she went to urgent care who sent her to the ED for further evaluation and rule out of possible appendicitis.  She has tried ibuprofen, MiraLAX, Pepto-Bismol without relief of her symptoms.  Denies diarrhea, constipation, melena, alopecia, urinary symptoms, vaginal itching, bleeding, discharge, chest pain, or shortness of breath.  No known potential exposures to COVID-19.  She is currently sexually active with one female partner, does not always use protection.    The history is provided by the patient.    History reviewed. No pertinent past medical history.  There are no active problems to display for this patient.   Past Surgical History:  Procedure Laterality Date   KNEE SURGERY  2014     OB History    Gravida  0   Para  0   Term  0   Preterm  0   AB  0   Living  0     SAB  0   TAB  0   Ectopic  0   Multiple  0   Live Births               Home Medications    Prior to Admission medications   Medication Sig Start Date End Date Taking? Authorizing Provider  bismuth subsalicylate (PEPTO BISMOL) 262 MG/15ML suspension Take 30 mLs by mouth every 6 (six) hours as needed for indigestion (nausea).   Yes [provider]  ibuprofen (ADVIL,MOTRIN) 200 MG tablet Take 600 mg by mouth every 6 (six) hours as needed for moderate pain  (stomach pain).    Yes [provider]  Levonorgestrel (KYLEENA) 19.5 MG IUD by Intrauterine route. Implanted winter 2016   Yes [provider]  polyethylene glycol (MIRALAX / GLYCOLAX) packet Take 17 g by mouth every 30 (thirty) minutes as needed for severe constipation. Mix in 8 oz fluid and drink   Yes [provider]  cephALEXin (KEFLEX) 500 MG capsule Take 1 capsule (500 mg total) by mouth 2 (two) times daily for 5 days. 05/18/19 05/23/19  Rodell Perna A, PA-C  metroNIDAZOLE (FLAGYL) 500 MG tablet Take 1 tablet (500 mg total) by mouth 2 (two) times daily for 7 days. 05/18/19 05/25/19  Rodell Perna A, PA-C  naproxen (NAPROSYN) 500 MG tablet Take 1 tablet (500 mg total) by mouth 2 (two) times daily with a meal. 05/18/19   Trevor Wilkie A, PA-C  ondansetron (ZOFRAN ODT) 4 MG disintegrating tablet Take 1 tablet (4 mg total) by mouth every 8 (eight) hours as needed for nausea or vomiting. 05/18/19   Renita Papa, PA-C    Family History History reviewed. No pertinent family history.  Social History Social History   Tobacco Use   Smoking status: Never Smoker   Smokeless tobacco: Never Used  Substance Use Topics   Alcohol use: Yes    Alcohol/week:  0.0 standard drinks    Comment: occasional drinker    Drug use: No     Allergies   Patient has no known allergies.   Review of Systems Review of Systems  Constitutional: Negative for chills and fever.  Respiratory: Negative for shortness of breath.   Cardiovascular: Negative for chest pain.  Gastrointestinal: Positive for abdominal pain, nausea and vomiting. Negative for constipation and diarrhea.  Genitourinary: Negative for vaginal bleeding, vaginal discharge and vaginal pain.  All other systems reviewed and are negative.    Physical Exam Updated Vital Signs BP 100/62 (BP Location: Right Arm)    Pulse (!) 58    Temp 98 F (36.7 C) (Oral)    Resp 17    Ht 5\' 9"  (1.753 m)    Wt 90.7 kg    SpO2 100%    BMI 29.53  kg/m   Physical Exam Vitals signs and nursing note reviewed.  Constitutional:      General: She is not in acute distress.    Appearance: She is well-developed.     Comments: Resting comfortably in bed  HENT:     Head: Normocephalic and atraumatic.  Eyes:     General:        Right eye: No discharge.        Left eye: No discharge.     Conjunctiva/sclera: Conjunctivae normal.  Neck:     Vascular: No JVD.     Trachea: No tracheal deviation.  Cardiovascular:     Rate and Rhythm: Normal rate and regular rhythm.  Pulmonary:     Effort: Pulmonary effort is normal.     Breath sounds: Normal breath sounds.  Abdominal:     General: Abdomen is flat. Bowel sounds are decreased. There is no distension.     Palpations: Abdomen is soft.     Tenderness: There is abdominal tenderness in the right upper quadrant, right lower quadrant, suprapubic area and left lower quadrant. There is no guarding or rebound. Positive signs include Murphy's sign, Rovsing's sign and McBurney's sign. Negative signs include psoas sign and obturator sign.  Genitourinary:    Cervix: No cervical motion tenderness.     Adnexa:        Right: No mass or tenderness.         Left: No mass or tenderness.       Comments: Examination performed in the presence of a chaperone.  Moderate amount of thick white discharge in the vaginal vault.  No cervical motion tenderness or adnexal tenderness.  No masses or lesions to the external genitalia. Skin:    General: Skin is warm and dry.     Findings: No erythema.  Neurological:     Mental Status: She is alert.  Psychiatric:        Behavior: Behavior normal.      ED Treatments / Results  Labs (all labs ordered are listed, but only abnormal results are displayed) Labs Reviewed  WET PREP, GENITAL - Abnormal; Notable for the following components:      Result Value   Clue Cells Wet Prep HPF POC PRESENT (*)    WBC, Wet Prep HPF POC MODERATE (*)    All other components within  normal limits  COMPREHENSIVE METABOLIC PANEL - Abnormal; Notable for the following components:   Glucose, Bld 100 (*)    AST 14 (*)    All other components within normal limits  CBC - Abnormal; Notable for the following components:   WBC 11.9 (*)  All other components within normal limits  URINALYSIS, ROUTINE W REFLEX MICROSCOPIC - Abnormal; Notable for the following components:   APPearance CLOUDY (*)    Hgb urine dipstick LARGE (*)    Ketones, ur 5 (*)    Leukocytes,Ua LARGE (*)    WBC, UA >50 (*)    Bacteria, UA RARE (*)    All other components within normal limits  URINE CULTURE  LIPASE, BLOOD  I-STAT BETA HCG BLOOD, ED ([facility], [facility], [facility] ONLY)  GC/CHLAMYDIA PROBE AMP ([place]) NOT AT [facility]    EKG None  Radiology Us Transvaginal Non-ob  Result Date: 05/18/2019 CLINICAL DATA:  Right lower quadrant pain for 2 weeks. EXAM: TRANSABDOMINAL AND TRANSVAGINAL ULTRASOUND OF PELVIS DOPPLER ULTRASOUND OF OVARIES TECHNIQUE: Both transabdominal and transvaginal ultrasound examinations of the pelvis were performed. Transabdominal technique was performed for global imaging of the pelvis including uterus, ovaries, adnexal regions, and pelvic cul-de-sac. It was necessary to proceed with endovaginal exam following the transabdominal exam to visualize the right adnexa. Color and duplex Doppler ultrasound was utilized to evaluate blood flow to the ovaries. COMPARISON:  Body CT May 18, 2019 FINDINGS: Uterus Measurements: 7.7 x 3.6 x 5.4 cm = volume: 78 mL. No fibroids or other mass visualized. Endometrium Thickness: Incompletely visualized due to presence of IUD with average thickness of 5 mm. Right ovary Measurements: 5.1 x 3.9 x 4.1 cm = volume: 43 mL. Benign cysts noted, the largest measuring 2.9 cm. Left ovary Measurements: 4.0 x 2.9 x 3.4 cm = volume: 20.6 cm mL. Benign cysts noted in the largest measuring 2 cm. Pulsed Doppler evaluation of both ovaries demonstrates normal low-resistance arterial and  venous waveforms. Other findings No abnormal free fluid. IMPRESSION: Bilateral benign ovarian cysts. IUD within the endometrial canal. Electronically Signed   By: Dobrinka  Dimitrova M.D.   On: 05/18/2019 16:16   Us Pelvis Complete  Result Date: 05/18/2019 CLINICAL DATA:  Right lower quadrant pain for 2 weeks. EXAM: TRANSABDOMINAL AND TRANSVAGINAL ULTRASOUND OF PELVIS DOPPLER ULTRASOUND OF OVARIES TECHNIQUE: Both transabdominal and transvaginal ultrasound examinations of the pelvis were performed. Transabdominal technique was performed for global imaging of the pelvis including uterus, ovaries, adnexal regions, and pelvic cul-de-sac. It was necessary to proceed with endovaginal exam following the transabdominal exam to visualize the right adnexa. Color and duplex Doppler ultrasound was utilized to evaluate blood flow to the ovaries. COMPARISON:  Body CT May 18, 2019 FINDINGS: Uterus Measurements: 7.7 x 3.6 x 5.4 cm = volume: 78 mL. No fibroids or other mass visualized. Endometrium Thickness: Incompletely visualized due to presence of IUD with average thickness of 5 mm. Right ovary Measurements: 5.1 x 3.9 x 4.1 cm = volume: 43 mL. Benign cysts noted, the largest measuring 2.9 cm. Left ovary Measurements: 4.0 x 2.9 x 3.4 cm = volume: 20.6 cm mL. Benign cysts noted in the largest measuring 2 cm. Pulsed Doppler evaluation of both ovaries demonstrates normal low-resistance arterial and venous waveforms. Other findings No abnormal free fluid. IMPRESSION: Bilateral benign ovarian cysts. IUD within the endometrial canal. Electronically Signed   By: Dobrinka  Dimitrova M.D.   On: 05/18/2019 16:16   Ct Abdomen Pelvis W Contrast  Result Date: 05/18/2019 CLINICAL DATA:  Lower abdominal pain. Vomiting. EXAM: CT ABDOMEN AND PELVIS WITH CONTRAST TECHNIQUE: Multidetector CT imaging of the abdomen and pelvis was performed using the standard protocol following bolus administration of intravenous contrast. CONTRAST:  <MEASUREMJerTrinitas Regional Medi<MEASUREMENJerLas [facility] - Amg Specialt<MEASUREMENJerGeneral [facility]it<MEASUREMENJerClay Surgery CentereldUE IOHEXOL 300 MG/ML  SOLN COMPARISON:  CT of the abdomen May 25, 2017 FINDINGS: Lower chest: No acute abnormality. Hepatobiliary: No focal liver abnormality is seen. No gallstones, gallbladder wall thickening, or biliary dilatation. Pancreas: Unremarkable. No pancreatic ductal dilatation or surrounding inflammatory changes. Spleen: Normal in size without focal abnormality. Adrenals/Urinary Tract: Adrenal glands are unremarkable. Kidneys are without renal calculi, focal lesion, or hydronephrosis. Bilateral renal cysts. Bladder is unremarkable. Stomach/Bowel: Stomach is within normal limits. Appendix appears normal. No evidence of bowel wall thickening, distention, or inflammatory changes. Vascular/Lymphatic: No significant vascular findings are present. No enlarged abdominal or pelvic lymph nodes. Reproductive: Uterus and bilateral adnexa are unremarkable. Bilateral physiologic ovarian cysts are noted. IUD in place. Other: No abdominal wall hernia or abnormality. No abdominopelvic ascites. Musculoskeletal: No acute or significant osseous findings. IMPRESSION: No evidence of acute abnormality within the abdomen or pelvis. Electronically Signed   By: Ted Mcalpineobrinka  Dimitrova M.D.   On: 05/18/2019 15:17   [place] Art/ven Flow Abd Pelv Doppler  Result Date: 05/18/2019 CLINICAL DATA:  Right lower quadrant pain for 2 weeks. EXAM: TRANSABDOMINAL AND TRANSVAGINAL ULTRASOUND OF PELVIS DOPPLER ULTRASOUND OF OVARIES TECHNIQUE: Both transabdominal and transvaginal ultrasound examinations of the pelvis were performed. Transabdominal technique was performed for global imaging of the pelvis including uterus, ovaries, adnexal regions, and pelvic cul-de-sac. It was necessary to proceed with endovaginal exam following the transabdominal exam to visualize the right adnexa. Color and duplex Doppler ultrasound was utilized to evaluate blood flow to the ovaries. COMPARISON:  Body CT May 18, 2019 FINDINGS: Uterus Measurements: 7.7 x  3.6 x 5.4 cm = volume: 78 mL. No fibroids or other mass visualized. Endometrium Thickness: Incompletely visualized due to presence of IUD with average thickness of 5 mm. Right ovary Measurements: 5.1 x 3.9 x 4.1 cm = volume: 43 mL. Benign cysts noted, the largest measuring 2.9 cm. Left ovary Measurements: 4.0 x 2.9 x 3.4 cm = volume: 20.6 cm mL. Benign cysts noted in the largest measuring 2 cm. Pulsed Doppler evaluation of both ovaries demonstrates normal low-resistance arterial and venous waveforms. Other findings No abnormal free fluid. IMPRESSION: Bilateral benign ovarian cysts. IUD within the endometrial canal. Electronically Signed   By: Ted Mcalpineobrinka  Dimitrova M.D.   On: 05/18/2019 16:16    Procedures Procedures (including critical care time)  Medications Ordered in ED Medications  morphine 4 MG/ML injection 4 mg (4 mg Intravenous Given 05/18/19 1437)  ondansetron (ZOFRAN) injection 4 mg (4 mg Intravenous Given 05/18/19 1435)  sodium chloride 0.9 % bolus 1,000 mL (0 mLs Intravenous Stopped 05/18/19 1715)  iohexol (OMNIPAQUE) 300 MG/ML solution 100 mL (100 mLs Intravenous Contrast Given 05/18/19 1501)  ketorolac (TORADOL) 30 MG/ML injection 30 mg (30 mg Intravenous Given 05/18/19 1720)     Initial Impression / Assessment and Plan / ED Course  I have reviewed the triage vital signs and the nursing notes.  Pertinent labs & imaging results that were available during my care of the patient were reviewed by me and considered in my medical decision making (see chart for details).        Patient presenting for evaluation of right lower quadrant abdominal pain.  Symptoms have been present for 2 weeks but acutely worsened in the last few days and she developed vomiting today.  Sent from urgent care for further evaluation.  She is afebrile, vital signs are stable.  She is nontoxic in appearance.  No peritoneal signs on examination of the abdomen.  Pain is worst in the right lower quadrant.  Lab work  reviewed by me shows mild  nonspecific leukocytosis, no anemia, no metabolic derangements.  LFTs, lipase, creatinine overall within normal limits.  Her pelvic examination is not concerning for PID but she does have clue cells and WBCs so will treat for BV.  UA is suggestive of UTI, will culture and start her on Keflex.  Imaging today shows no acute intra-abdominal pathology and pelvic ultrasound shows no evidence of TOA, ovarian torsion, or ectopic pregnancy.  She has bilateral benign ovarian cysts and IUD in place within the endometrial canal.  No evidence of obstruction, perforation, appendicitis, cholecystitis, or aortic dissection on work-up today. On reevaluation the patient is resting comfortably in no apparent distress.  Serial abdominal examinations are benign.  She is tolerating p.o. fluid without difficulty.  Will discharge home with antibiotics for BV and UTI.  Recommend follow-up with OB/GYN or PCP for reevaluation of symptoms.  Discussed strict ED return precautions. Pt verbalized understanding of and agreement with plan and is safe for discharge home at this time.   Final Clinical Impressions(s) / ED Diagnoses   Final diagnoses:  Acute cystitis with hematuria  BV (bacterial vaginosis)  Right lower quadrant abdominal pain  Non-intractable vomiting with nausea, unspecified vomiting type    ED Discharge Orders         Ordered    ondansetron (ZOFRAN ODT) 4 MG disintegrating tablet  Every 8 hours PRN     05/18/19 1705    cephALEXin (KEFLEX) 500 MG capsule  2 times daily     05/18/19 1705    metroNIDAZOLE (FLAGYL) 500 MG tablet  2 times daily     05/18/19 1705    naproxen (NAPROSYN) 500 MG tablet  2 times daily with meals     05/18/19 1730           Jeanie Sewer, PA-C 05/18/19 1810    Raeford Razor, MD 05/19/19 0800

## 2019-05-18 NOTE — ED Notes (Signed)
Patient verbalizes understanding of discharge instructions. Opportunity for questioning and answers were provided. Armband removed by staff, pt discharged from ED.  

## 2019-05-18 NOTE — Discharge Instructions (Addendum)
I am recommending emergency department evaluation given the absence of UTI symptoms. Would like to rule out an acute appendicitis as the cause of your abdominal pain. Please go immediately for further evaluation.

## 2019-05-18 NOTE — ED Notes (Signed)
Patient transported to CT 

## 2019-05-18 NOTE — ED Provider Notes (Signed)
[facility]    CSN: [id number] Arrival date & time: 05/18/19  1028      History   Chief Complaint No chief complaint on file.   HPI Daisy Roberts is a 23 y.o. female.   HPI  Patient presents with a two-week history of right lower quadrant abdominal pain.  She reports over the course of the last few days the pain has worsened in intensity.  She reports with movement the pain is a 10 out of 10.  She has no history of gallbladder disease and has her appendix.  She endorses some chills with night sweats.  It is also present at rest however that is exacerbated with movement.  She is taking ibuprofen, Pepto-Bismol, and recently MiraLAX without improvement of symptoms.  She is having regular bowel movements.by increase in frequency of stools would help improve abdominal pain.  She had nausea throughout the course of the illness however today actually vomited which made her go concern for worsening of symptoms.  She has had a recent negative pregnancy test today.  She has an IUD in place which expires in December.  Has no respiratory symptoms, headache or sore throat.  Concern for exposure to COVID-19.  She endorses a decreased appetite.  She denies dysuria or urinary frequency.  Has a history of a UTI and notes this she does not have similar symptoms.  No past medical history on file.  There are no active problems to display for this patient.   Past Surgical History:  Procedure Laterality Date  . KNEE SURGERY  2014    OB History    Gravida  0   Para  0   Term  0   Preterm  0   AB  0   Living  0     SAB  0   TAB  0   Ectopic  0   Multiple  0   Live Births               Home Medications    Prior to Admission medications   Medication Sig Start Date End Date Taking? Authorizing Provider  cephALEXin (KEFLEX) 500 MG capsule Take 1 capsule (500 mg total) by mouth 4 (four) times daily. 05/26/17   Cathren LaineSteinl, Kevin, MD  ferrous sulfate 325 (65 FE) MG tablet  Take 325 mg by mouth daily with breakfast.    [provider]  ibuprofen (ADVIL,MOTRIN) 200 MG tablet Take 600 mg by mouth every 6 (six) hours as needed (pain).    [provider]  Levonorgestrel (KYLEENA) 19.5 MG IUD by Intrauterine route. Implanted winter 2016    [provider]  polyethylene glycol (MIRALAX / GLYCOLAX) packet Take 17 g by mouth every 30 (thirty) minutes as needed for severe constipation. Mix in 8 oz fluid and drink    [provider]    Family History No family history on file.  Social History Social History   Tobacco Use  . Smoking status: Never Smoker  . Smokeless tobacco: Never Used  Substance Use Topics  . Alcohol use: Yes    Alcohol/week: 0.0 standard drinks    Comment: occasional drinker   . Drug use: No     Allergies   Patient has no known allergies.   Review of Systems Review of Systems Pertinent negatives listed in HPI  Physical Exam Triage Vital Signs ED Triage Vitals  Enc Vitals Group     BP      Pulse  Resp      Temp      Temp src      SpO2      Weight      Height      Head Circumference      Peak Flow      Pain Score      Pain Loc      Pain Edu?      Excl. in [place]?    No data found.  Updated Vital Signs BP (!) 109/59 (BP Location: Right Arm)   Pulse 84   Temp 98.6 F (37 C) (Oral)   Resp 16   SpO2 100%   Visual Acuity Right Eye Distance:   Left Eye Distance:   Bilateral Distance:    Right Eye Near:   Left Eye Near:    Bilateral Near:     Physical Exam Constitutional:      General: She is in acute distress.  Cardiovascular:     Rate and Rhythm: Normal rate and regular rhythm.  Pulmonary:     Effort: Pulmonary effort is normal.     Breath sounds: Normal breath sounds.  Abdominal:     General: Abdomen is flat. Bowel sounds are decreased.     Palpations: Abdomen is rigid.     Tenderness: There is abdominal tenderness in the right lower quadrant and suprapubic area.  Positive signs include Rovsing's sign.     Hernia: No hernia is present.  Neurological:     Mental Status: She is alert.    UC Treatments / Results  Labs (all labs ordered are listed, but only abnormal results are displayed) Labs Reviewed  POCT URINALYSIS DIP (DEVICE) - Abnormal; Notable for the following components:      Result Value   Hgb urine dipstick LARGE (*)    Protein, ur 100 (*)    Leukocytes,Ua LARGE (*)    All other components within normal limits  POC URINE PREG, ED  POCT PREGNANCY, URINE    EKG   Radiology No results found.  Procedures Procedures (including critical care time)  Medications Ordered in UC Medications - No data to display  Initial Impression / Assessment and Plan / UC Course  I have reviewed the triage vital signs and the nursing notes.  Pertinent labs & imaging results that were available during my care of the patient were reviewed by me and considered in my medical decision making (see chart for details).   [name] is being deferred to the ER for further work-up and CT imaging to rule out an acute abdominal process given abnormal findings on exam. She is afebrile, slightly distressed related to pain, otherwise, stable to self transport to ER. Offered RN transport, however, patient declined. UA is significant for possible UTI however, patient is completely asymptomatic of UTI symptoms. Discussed concerns with patient. She verbalized understanding and agreed to follow-up at the ER.  Final Clinical Impressions(s) / UC Diagnoses   Final diagnoses:  Right lower quadrant abdominal pain     Discharge Instructions     I am recommending emergency department evaluation given the absence of UTI symptoms. Would like to rule out an acute appendicitis as the cause of your abdominal pain. Please go immediately for further evaluation.    ED Prescriptions    None     Controlled Substance Prescriptions [place] Controlled Substance Registry consulted? Not  Applicable   Scot Jun, FNP 05/19/19 1024

## 2019-05-18 NOTE — ED Triage Notes (Signed)
Patient arrives from [facility] with c/o lower right quadrant for 2 weeks. Went to urgent care today, but was sent here for more care.

## 2019-05-18 NOTE — Discharge Instructions (Addendum)
Please take all of your antibiotics until finished!   You may develop abdominal discomfort or diarrhea from the antibiotic.  You may help offset this with probiotics which you can buy or get in yogurt. Do not eat  or take the probiotics until 2 hours after your antibiotic.   Your pelvic exam today showed evidence of bacterial vaginosis.  This is not an STD, but it is an overgrowth of the normal bacteria within the vagina.  It can cause lower abdominal pain and abnormal vaginal discharge.  It is treated with an antibiotic called Flagyl.  Take this twice a day with food.  Do not drink any alcohol while taking this medication as it can make you very sick.  Your other antibiotic, cephalexin, will treat your urinary tract infection.  You can take Zofran as needed for nausea and vomiting.  Let this medicine dissolve under your tongue and wait around 10 to 15 minutes before having anything to eat or drink to give this medicine time to work.  Eat a diet of bland foods that will not upset your stomach and avoid fried foods, spicy foods, fatty foods, or alcohol.  Follow-up with an OB/GYN for reevaluation of your ovarian cysts.  You can also follow-up with your primary care doctor in the next 3 or 4 days for reevaluation of your symptoms.  Return to the emergency department if any concerning signs or symptoms develop such as persistent vomiting, fevers, or severe abdominal pain.

## 2019-05-19 LAB — URINE CULTURE: Culture: <10000 — AB

## 2019-05-21 LAB — GC/CHLAMYDIA PROBE AMP (~~LOC~~) NOT AT ARMC
Chlamydia: NEGATIVE
Neisseria Gonorrhea: POSITIVE — AB

## 2019-12-30 ENCOUNTER — Ambulatory Visit: Payer: No Typology Code available for payment source | Admitting: Obstetrics and Gynecology

## 2019-12-30 ENCOUNTER — Other Ambulatory Visit: Payer: Self-pay

## 2020-01-20 ENCOUNTER — Encounter: Payer: Self-pay | Admitting: Obstetrics and Gynecology

## 2020-01-20 ENCOUNTER — Other Ambulatory Visit: Payer: Self-pay

## 2020-01-20 ENCOUNTER — Ambulatory Visit (INDEPENDENT_AMBULATORY_CARE_PROVIDER_SITE_OTHER): Payer: No Typology Code available for payment source | Admitting: Obstetrics and Gynecology

## 2020-01-20 VITALS — BP 124/70 | Ht 68.0 in | Wt 207.0 lb

## 2020-01-20 DIAGNOSIS — N898 Other specified noninflammatory disorders of vagina: Secondary | ICD-10-CM

## 2020-01-20 DIAGNOSIS — R102 Pelvic and perineal pain: Secondary | ICD-10-CM

## 2020-01-20 DIAGNOSIS — N83201 Unspecified ovarian cyst, right side: Secondary | ICD-10-CM | POA: Diagnosis not present

## 2020-01-20 DIAGNOSIS — Z113 Encounter for screening for infections with a predominantly sexual mode of transmission: Secondary | ICD-10-CM | POA: Diagnosis not present

## 2020-01-20 DIAGNOSIS — N83202 Unspecified ovarian cyst, left side: Secondary | ICD-10-CM

## 2020-01-20 DIAGNOSIS — Z975 Presence of (intrauterine) contraceptive device: Secondary | ICD-10-CM

## 2020-01-20 LAB — WET PREP FOR TRICH, YEAST, CLUE

## 2020-01-20 NOTE — Progress Notes (Signed)
   Daisy Roberts  05/19/1996 [id number]  HPI The patient is a 24 y.o. G0P0000 who presents today for discussion of IUD replacement.  She believes she has had an IUD in place since 2017, has been amenorrheic and doing well on it. She was wondering if the IUD needed to be replaced. Secondly, she requests STI testing. Her partner recently had sexual relations with someone else, so she wants to do STI testing today.  No symptoms, denies discharge or irregular bleeding.  Lastly, she was diagnosed with ovarian cysts last summer when she was having pelvic pain, and ultrasound from 05/2019 showed a left ovarian cyst 2.9 cm, and right ovarian cyst 2.0 cm, both were simple.  Her pelvic pain has much improved since then, but still gets twinges of pain on right side.  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  Past medical history,surgical history, problem list, medications, allergies, family history and social history were all reviewed and documented as reviewed in the EPIC chart.  ROS:  Feeling well. No urinary tract symptoms. GYN ROS: no abnormal bleeding or discharge  Physical Exam  BP 124/70   Ht 5\' 8"  (1.727 m)   Wt 207 lb (93.9 kg)   BMI 31.47 kg/m   General: Pleasant female, no acute distress, alert and oriented PELVIC EXAM: VULVA: normal appearing vulva with no masses, tenderness or lesions, VAGINA: normal appearing vagina with normal color and slightly thickened white discharge, no lesions, CERVIX: normal appearing cervix without discharge or lesions, IUD strings protrude about 2 cm from the external cervical os, UTERUS: uterus is normal size, shape, consistency and nontender, ADNEXA: tenderness right, no discrete mass Vaginal wet mount negative for yeast, trichomonas, clue cells.  present for the examination  Assessment 24 yo G0 here for IUD surveillance, STI screen, and right lower quadrant pain with history of ovarian cysts  Plan 1.   IUD.  Review of the notes from Dr. 21 indicates that she had a Liletta IUD placed 12/2015.  Medication reconciliation notes a Kyleena, but patient herself does not recall.  Since the physician's notes indicate Litletta (on several occasions) this is the best information to base our clinical decision making off of.  As 01/2016 is effective for up to 6 years, it does not need to be replaced at this time and would not be due for removal until 2023.  She is reassured by this as she is not interested in conceiving at this time.  IUD strings are visualized and device appears to be in a normal position on exam today. 2.  STI screening performed per patient request.  Vaginal wet mount to screen for trichomonas (negative), and DNA probe for gonorrhea and chlamydia are collected.  Serum screening is offered and she declines this portion. 3.  Right lower quadrant pain.  We discussed the typically benign nature of ovarian cysts.  Her pain is not as bad as it was last summer when she had the pelvic ultrasound performed, but sometimes persistent cysts can be an issue.  Discussed that hormonal IUDs can sometimes be associated with formation of ovarian cysts as they do not reliably stop ovulation.  I recommend that she make an appointment for a pelvic ultrasound so we can follow-up on the ovarian cysts from previous imaging.   2024 MD 01/20/20

## 2020-01-21 LAB — C. TRACHOMATIS/N. GONORRHOEAE RNA
C. trachomatis RNA, TMA: NOT DETECTED
N. gonorrhoeae RNA, TMA: NOT DETECTED

## 2020-01-30 ENCOUNTER — Ambulatory Visit: Payer: No Typology Code available for payment source | Admitting: Obstetrics and Gynecology

## 2020-01-30 ENCOUNTER — Other Ambulatory Visit: Payer: No Typology Code available for payment source

## 2020-02-16 IMAGING — CT CT ABDOMEN AND PELVIS WITH CONTRAST
2 of 4 series · 17 of 46 positions shown, 19 images · IV contrast (omnipaque)
Comparison: CT of the abdomen May 25, 2017

CLINICAL DATA: Lower abdominal pain. Vomiting.

EXAM:
CT ABDOMEN AND PELVIS WITH CONTRAST
TECHNIQUE: Multidetector CT imaging of the abdomen and pelvis was performed
using the standard protocol following bolus administration of
intravenous contrast.
CONTRAST:  100mL OMNIPAQUE IOHEXOL 300 MG/ML  SOLN

[Series 3: abd/ pelvis 5.0 i30f 2 · axial · 0.92mm/px · z∈[+789,+1229]mm · 14 of 98 slices shown, 16 images]
[im 5/98  soft-tissue]
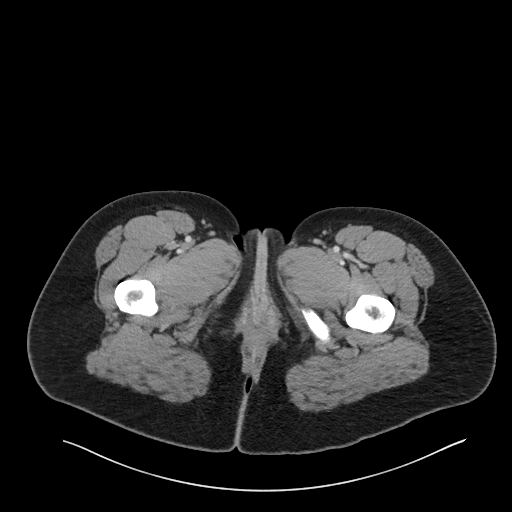
[im 5/98  bone]
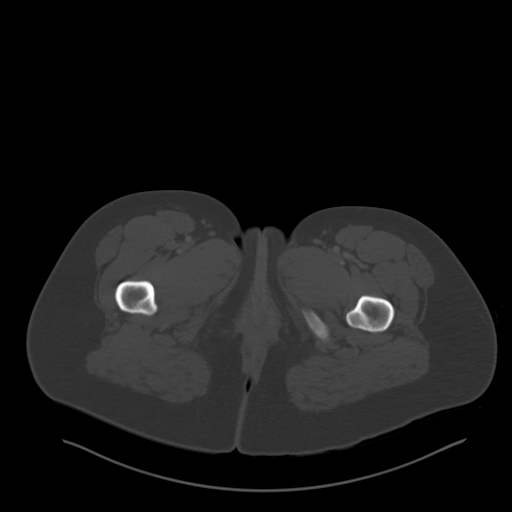
[im 13/98  soft-tissue]
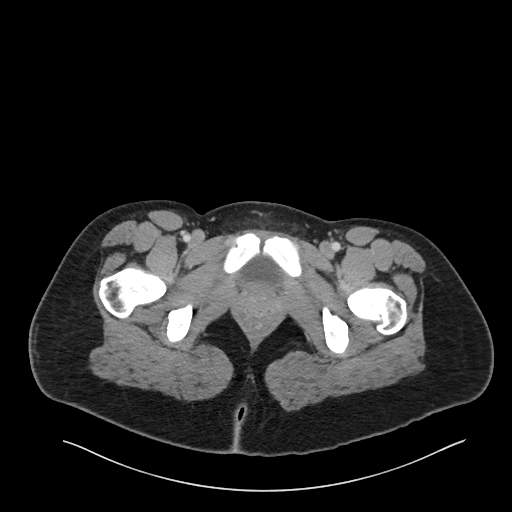
[im 21/98  soft-tissue]
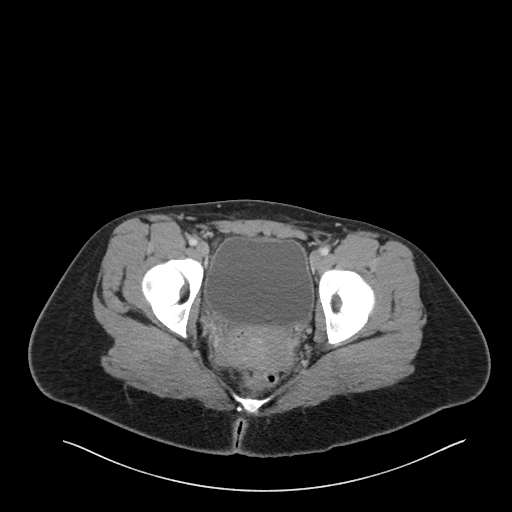
[im 25/98  soft-tissue]
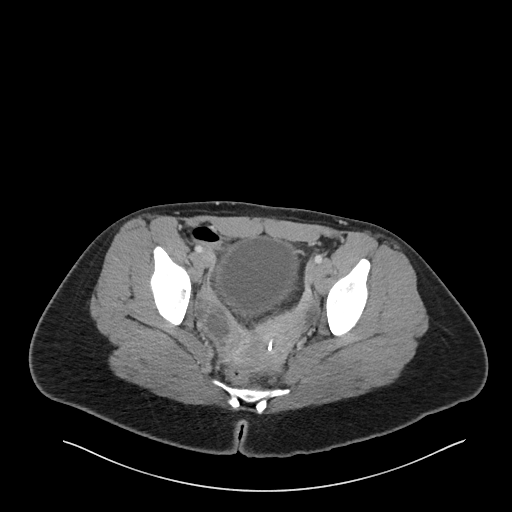
[im 33/98  soft-tissue]
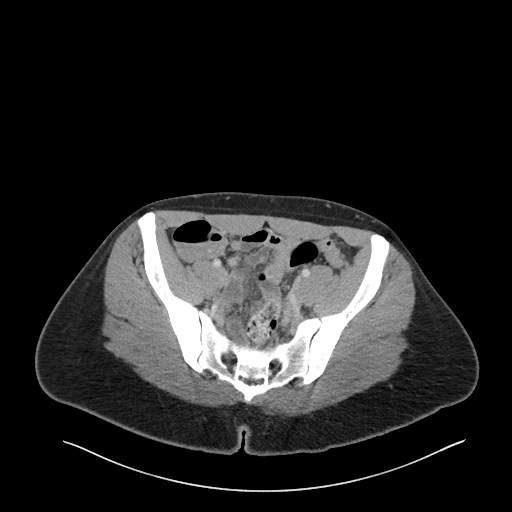
[im 41/98  soft-tissue]
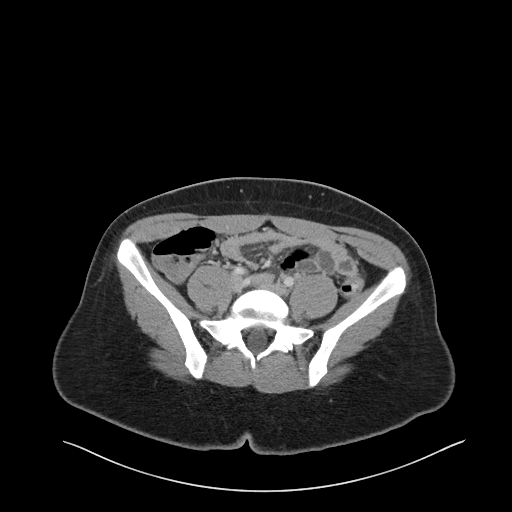
[im 45/98  soft-tissue]
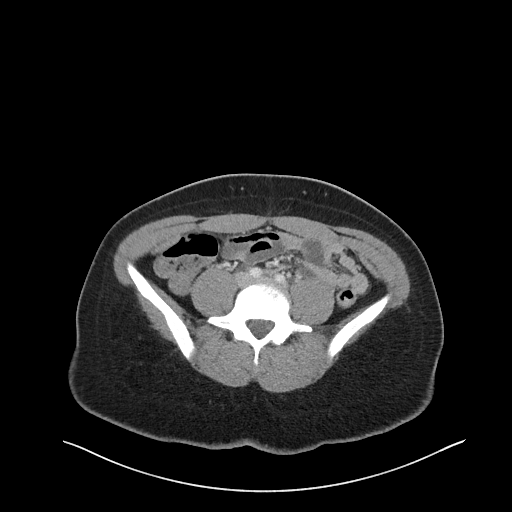
[im 53/98  soft-tissue]
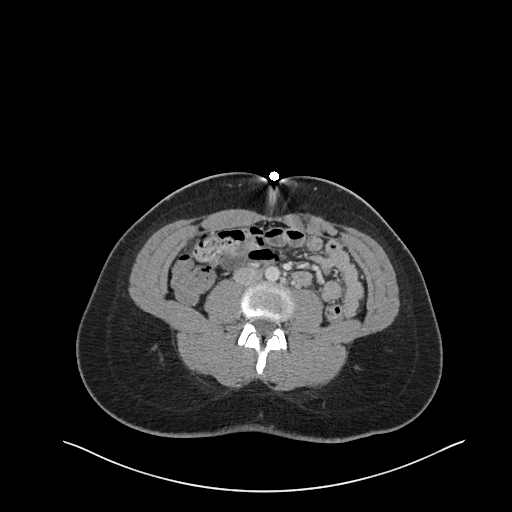
[im 57/98  soft-tissue]
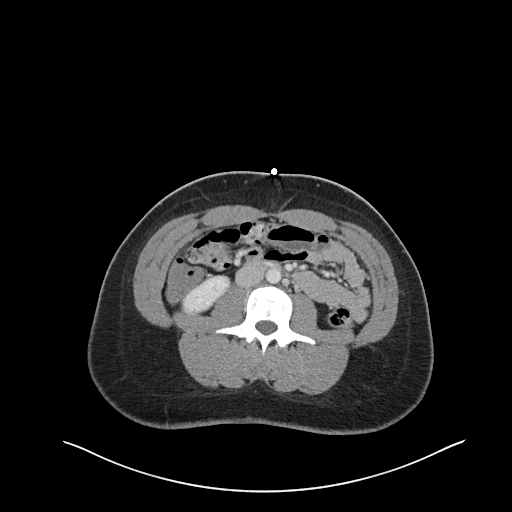
[im 57/98  bone]
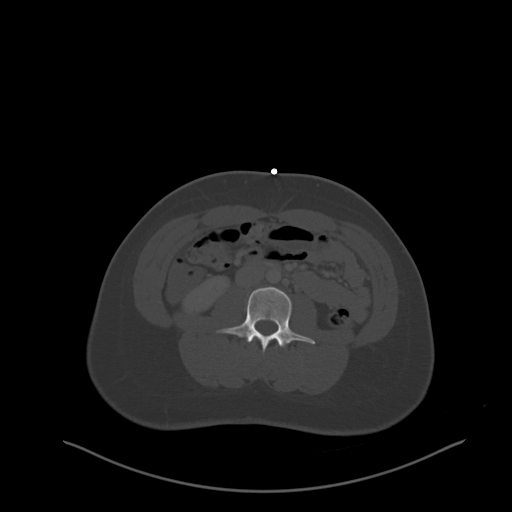
[im 65/98  soft-tissue]
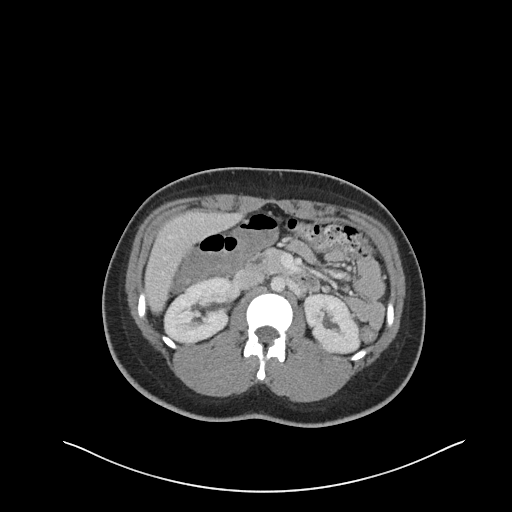
[im 73/98  soft-tissue]
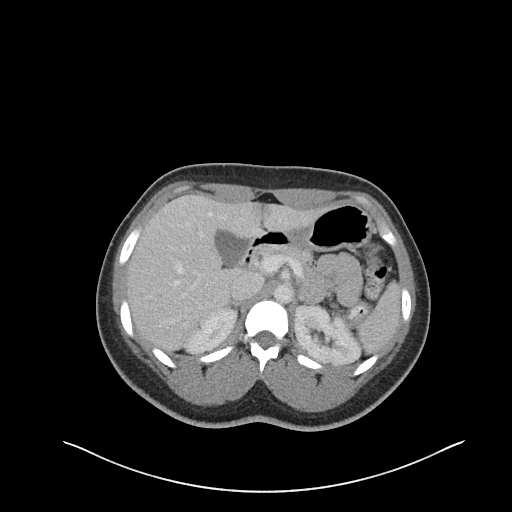
[im 77/98  soft-tissue]
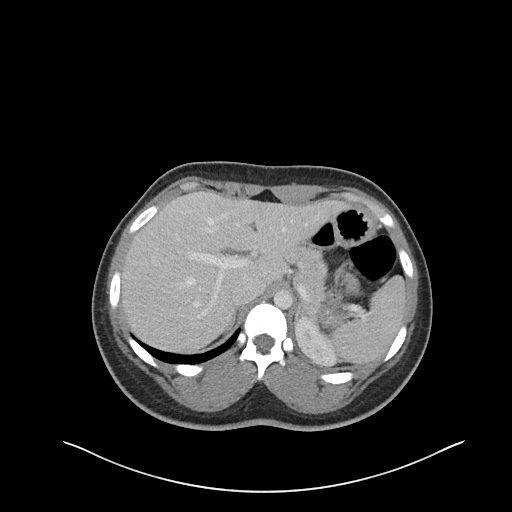
[im 85/98  soft-tissue]
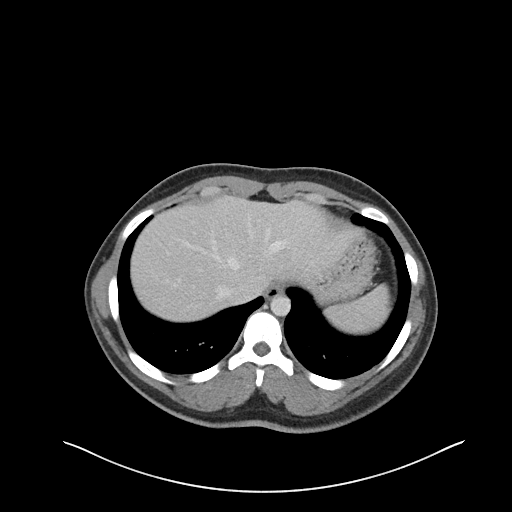
[im 93/98  soft-tissue]
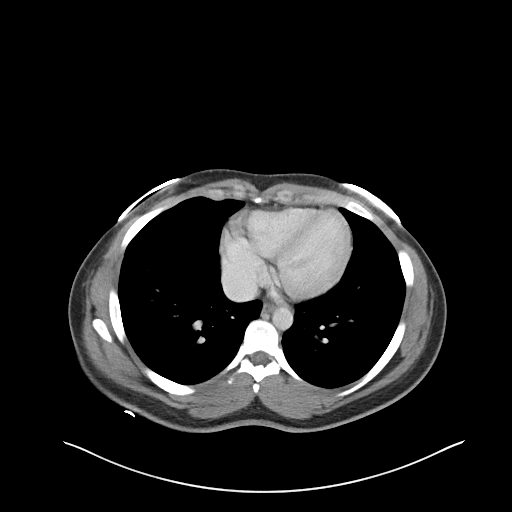

[Series 6: coronal soft tissue · coronal · 0.97mm/px · 3 of 110 slices shown]
[im 37/110  soft-tissue]
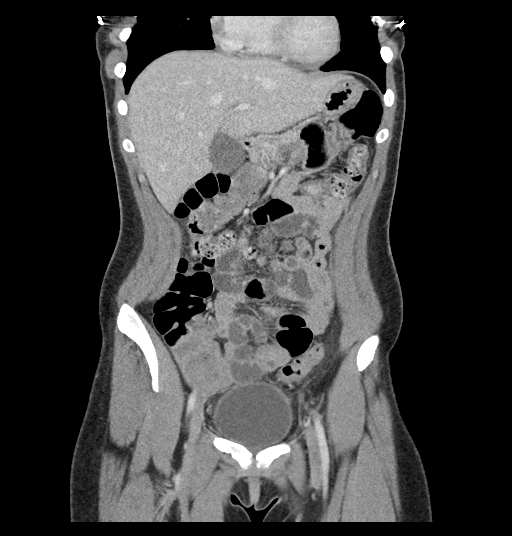
[im 49/110  soft-tissue]
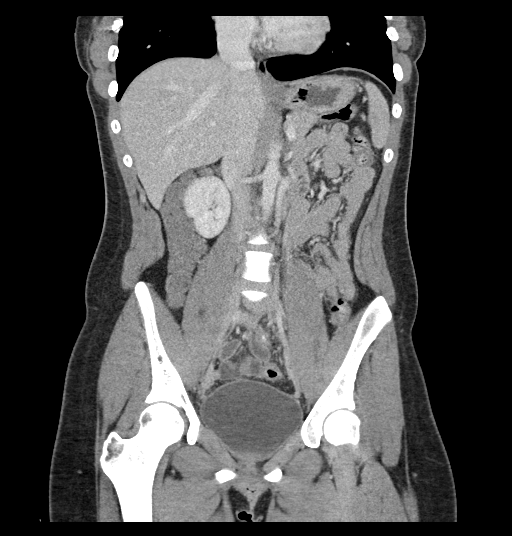
[im 61/110  soft-tissue]
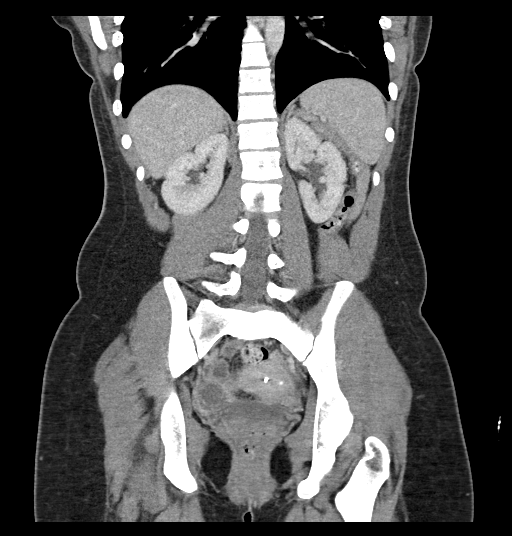

[17 of 46 positions shown; findings below may reference images not displayed]

FINDINGS: Lower chest: No acute abnormality.

Hepatobiliary: No focal liver abnormality is seen. No gallstones,
gallbladder wall thickening, or biliary dilatation.

Pancreas: Unremarkable. No pancreatic ductal dilatation or
surrounding inflammatory changes.

Spleen: Normal in size without focal abnormality.

Adrenals/Urinary Tract: Adrenal glands are unremarkable. Kidneys are
without renal calculi, focal lesion, or hydronephrosis. Bilateral
renal cysts. Bladder is unremarkable.

Stomach/Bowel: Stomach is within normal limits. Appendix appears
normal. No evidence of bowel wall thickening, distention, or
inflammatory changes.

Vascular/Lymphatic: No significant vascular findings are present. No
enlarged abdominal or pelvic lymph nodes.

Reproductive: Uterus and bilateral adnexa are unremarkable.
Bilateral physiologic ovarian cysts are noted. IUD in place.

Other: No abdominal wall hernia or abnormality. No abdominopelvic
ascites.

Musculoskeletal: No acute or significant osseous findings.
IMPRESSION: No evidence of acute abnormality within the abdomen or pelvis.
